# Patient Record
Sex: Male | Born: 2005 | Race: Black or African American | Hispanic: No | Marital: Single | Smoking: Never smoker
Health system: Southern US, Community
[De-identification: ages and names within clinical notes are randomized; demographics above are authoritative.]

## PROBLEM LIST (undated history)

## (undated) DIAGNOSIS — J45909 Unspecified asthma, uncomplicated: Secondary | ICD-10-CM

---
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
## 2024-01-16 ENCOUNTER — Ambulatory Visit: Admitting: Cardiology

## 2024-01-16 NOTE — Progress Notes (Deleted)
 Cardiology Office Note:    Date:  01/16/2024   ID:  Allen Willis, DOB 06/28/2006, MRN [id number]  PCP:  [facility]  Cardiologist:  None  Electrophysiologist:  None   Referring MD: Leanora Prophet, PA-C   No chief complaint on file. ***  History of Present Illness:    Allen Willis is a 18 y.o. male with a hx of asthma who is referred by Leanora Prophet, PA for evaluation of palpitations.  Past Medical History:  Diagnosis Date   Asthma     No past surgical history on file.  Current Medications: No outpatient medications have been marked as taking for the 01/16/24 encounter (Appointment) with Wendie Hamburg, MD.     Allergies:   Patient has no known allergies.   Social History   Socioeconomic History   Marital status: Single    Spouse name: Not on file   Number of children: Not on file   Years of education: Not on file   Highest education level: Not on file  Occupational History   Not on file  Tobacco Use   Smoking status: Never   Smokeless tobacco: Never  Substance and Sexual Activity   Alcohol use: No   Drug use: Not on file   Sexual activity: Not on file  Other Topics Concern   Not on file  Social History Narrative   Not on file   Social Drivers of Health   Financial Resource Strain: Not on File (12/27/2021)   Received from [employer]    Financial Resource Strain: 0  Food Insecurity: Not at Risk (01/07/2023)   Received from [facility] Insecurity    Food: 1  Transportation Needs: Not at Risk (01/07/2023)   Received from [employer] Needs    Transportation: 1  Physical Activity: Not on File (12/27/2021)   Received from [facility]   Physical Activity    Physical Activity: 0  Stress: Not on File (12/27/2021)   Received from [facility]   Stress    Stress: 0  Social Connections: Not on File (05/19/2023)   Received from [facility]   Social Connections    Connectedness: 0     Family  History: The patient's ***family history is not on file.  ROS:   Please see the history of present illness.    *** All other systems reviewed and are negative.  EKGs/Labs/Other Studies Reviewed:    The following studies were reviewed today: ***  EKG:  EKG is *** ordered today.  The ekg ordered today demonstrates ***  Recent Labs: No results found for requested labs within last 365 days.  Recent Lipid Panel No results found for: "CHOL", "TRIG", "HDL", "CHOLHDL", "VLDL", "LDLCALC", "LDLDIRECT"  Physical Exam:    VS:  There were no vitals taken for this visit.    Wt Readings from Last 3 Encounters:  10/02/18 118 lb 2.7 oz (53.6 kg) (65%, Z= 0.39)*  06/22/16 99 lb (44.9 kg) (79%, Z= 0.80)*  06/06/14 80 lb (36.3 kg) (84%, Z= 1.00)*   * Growth percentiles are based on CDC (Boys, 2-20 Years) data.     GEN: *** Well nourished, well developed in no acute distress HEENT: Normal NECK: No JVD; No carotid bruits LYMPHATICS: No lymphadenopathy CARDIAC: ***RRR, no murmurs, rubs, gallops RESPIRATORY:  Clear to auscultation without rales, wheezing or rhonchi  ABDOMEN: Soft, non-tender, non-distended MUSCULOSKELETAL:  No edema; No deformity  SKIN: Warm and dry NEUROLOGIC:  Alert and oriented x 3 PSYCHIATRIC:  Normal affect   ASSESSMENT:    No diagnosis found. PLAN:    Palpitations:  RTC***  Medication Adjustments/Labs and Tests Ordered: Current medicines are reviewed at length with the patient today.  Concerns regarding medicines are outlined above.  No orders of the defined types were placed in this encounter.  No orders of the defined types were placed in this encounter.   There are no Patient Instructions on file for this visit.   Signed, Wendie Hamburg, MD  01/16/2024 8:19 AM    [facility]

## 2024-02-06 ENCOUNTER — Other Ambulatory Visit: Payer: Self-pay | Admitting: Medical Genetics

## 2024-02-23 ENCOUNTER — Ambulatory Visit: Attending: Cardiology

## 2024-02-23 ENCOUNTER — Ambulatory Visit

## 2024-02-23 ENCOUNTER — Encounter: Payer: Self-pay | Admitting: Cardiology

## 2024-02-23 ENCOUNTER — Other Ambulatory Visit: Payer: Self-pay | Admitting: *Deleted

## 2024-02-23 ENCOUNTER — Ambulatory Visit: Attending: Cardiology | Admitting: Cardiology

## 2024-02-23 ENCOUNTER — Other Ambulatory Visit: Payer: Self-pay | Admitting: Cardiology

## 2024-02-23 VITALS — BP 110/50 | HR 76 | Ht 70.5 in | Wt 167.0 lb

## 2024-02-23 DIAGNOSIS — I499 Cardiac arrhythmia, unspecified: Secondary | ICD-10-CM

## 2024-02-23 DIAGNOSIS — R000 Tachycardia, unspecified: Secondary | ICD-10-CM

## 2024-02-23 DIAGNOSIS — R42 Dizziness and giddiness: Secondary | ICD-10-CM | POA: Diagnosis present

## 2024-02-23 DIAGNOSIS — I279 Pulmonary heart disease, unspecified: Secondary | ICD-10-CM

## 2024-02-23 DIAGNOSIS — R002 Palpitations: Secondary | ICD-10-CM | POA: Insufficient documentation

## 2024-02-23 NOTE — Progress Notes (Unsigned)
 Cardiology Office Note:  .   Date:  02/25/2024  ID:  Allen Willis, DOB 11-15-2005, MRN [id number] PCP: [facility]  [facility] Providers Cardiologist:  Randene Bustard, MD     No chief complaint on file.   Patient Profile: .     Allen Willis is a  18 y.o. male with a no prior PMH who presents here for evaluation of palpitations, and possible syncope at the request of Leanora Prophet, PA-C. -     Allen Willis was seen on January 07, 2024 by Leanora Prophet, PA noting feeling his heart racing.  Episodes last maybe 10 to 20 seconds.  Usually occur when resting or lying down.  Often times has a sleeping.  No triggers.  Also notes some orthostatic dizziness when standing up quickly after sitting down.  Maybe 2-3 syncopal episodes.  Thyroid levels checked along with A1c, lipid panel, iron panel/CBC: Referral to cardiology.  Subjective  Discussed the use of AI scribe software for clinical note transcription with the patient, who gave verbal consent to proceed.  History of Present Illness History of Present Illness Allen Willis is a 18 year old male who presents with palpitations and episodes of dizziness. He is accompanied by his mother.  He experiences palpitations described as his heart beating hard, particularly upon waking or stretching. These episodes occur approximately three times a month and last about five to seven seconds. No associated syncope or near-syncope during these episodes. He denies any irregular or skipping heartbeats during these episodes.  He experiences dizziness when standing up too quickly after resting, especially post-exercise. The dizziness is more pronounced when he has not consumed enough water before exercising. He drinks water before and after workouts but acknowledges that inadequate hydration exacerbates the symptoms. The last episode of near-syncope occurred a couple of months ago after playing  soccer without adequate hydration or nutrition.  No chest pain, syncope, or irregular heartbeats. He denies the use of caffeine, sodas, or energy drinks but consumes sweet drinks and milkshakes occasionally. 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  His father's side of the family has a history of heart issues, though details are unspecified.     Objective   At last PCPs visit, notes he drinks at least 2 big bottles of water a day.  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  No alcohol vaping, cigarettes or drugs.  Studies Reviewed: Aaron Aas   EKG Interpretation Date/Time:  Monday February 23 2024 13:48:45 EDT Ventricular Rate:  76 PR Interval:  140 QRS Duration:  90 QT Interval:  342 QTC Calculation: 384 R Axis:   76  Text Interpretation: Normal sinus rhythm with sinus arrhythmia Normal ECG No previous ECGs available Confirmed by Randene Bustard ([id number]) on 02/23/2024 2:03:08 PM    No studies  Labs from PCP: 01/07/2023: TC 149, TG 50, HDL 69, LDL 69.;  TSH 0.877, free X32.44 01/09/2024: TC 143, TG 91, HDL 58, LDL 68.;  TSH 1.41, free T4 1.01; BUN/Cr 10/0.99, K+ 4.8.  Magnesium 2.  Normal iron panel   Risk Assessment/Calculations:          Physical Exam:   VS:  BP (!) 110/50   Pulse 76   Ht 5' 10.5 (1.791 m)   Wt 167 lb (75.8 kg)   SpO2 98%   BMI 23.62 kg/m    Wt Readings from Last 3 Encounters:  02/23/24  167 lb (75.8 kg) (70%, Z= 0.53)*  10/02/18 118 lb 2.7 oz (53.6 kg) (65%, Z= 0.39)*  06/22/16 99 lb (44.9 kg) (79%, Z= 0.80)*   * Growth percentiles are based on CDC (Boys, 2-20 Years) data.    GEN: Well nourished, well developed in no acute distress; well-groomed.  Healthy-appearing NECK: No JVD; No carotid bruits CARDIAC: Normal S1 and S2; RRR, no murmurs, rubs, gallops RESPIRATORY:  Clear to auscultation without rales, wheezing or rhonchi ; nonlabored, good air movement. ABDOMEN: Soft, non-tender,  non-distended EXTREMITIES:  No edema; No deformity      ASSESSMENT AND PLAN: .    Problem List Items Addressed This Visit     Dizziness   Dizziness with postural changes likely due to dehydration and transient hypotension. Emphasized hydration to prevent symptoms. - Advise on adequate hydration before and after physical activity to prevent dizziness.      Relevant Orders   EKG 12-Lead (Completed)   Palpitations - Primary   Intermittent palpitations likely benign tachycardia, possibly due to dehydration or electrolyte imbalance. No syncope or irregular heartbeats. Discussed triggers and management strategies. - Order 30-day cardiac monitor to evaluate heart rhythm during episodes. - Educate on maintaining hydration, especially before and after exercise. - Instruct on performing vagal maneuvers during episodes to slow heart rate.              Follow-Up: Return in about 6 weeks (around 04/05/2024) for Routine Follow-up after testing ~ 1-2 months, Followup with Telemedicine, To discuss test results.     Signed, Arleen Lacer, MD, MS Randene Bustard, M.D., M.S. Interventional Chartered certified accountant  Pager # [phone]

## 2024-02-23 NOTE — Progress Notes (Unsigned)
 Enrolled for [employer] to mail a ZIO XT long term holter monitor to the patients address on file.   2nd consecutive 14 day zio xt monitor

## 2024-02-23 NOTE — Progress Notes (Unsigned)
 Enrolled for [employer] to mail a ZIO XT long term holter monitor to the patients address on file.  First of 2 consecutive 14 day zio monitors.

## 2024-02-23 NOTE — Patient Instructions (Addendum)
 Medication Instructions:   No changes   Other Instructions  Keep well hydrated , if you are going to exercise  hydrate the day before and  day of   Lab Work: Not needed    Testing/Procedures: Your physician has recommended that you wear a holter monitor Zio 14 day x2 . Holter monitors are medical devices that record the heart's electrical activity. Doctors most often use these monitors to diagnose arrhythmias. Arrhythmias are problems with the speed or rhythm of the heartbeat. The monitor is a small, portable device. You can wear one while you do your normal daily activities. This is usually used to diagnose what is causing palpitations/syncope (passing out).    Follow-Up: At [facility], you and your health needs are our priority.  As part of our continuing mission to provide you with exceptional heart care, we have created designated Provider Care Teams.  These Care Teams include your primary Cardiologist (physician) and Advanced Practice Providers (APPs -  Physician Assistants and Nurse Practitioners) who all work together to provide you with the care you need, when you need it.     Your next appointment:   2 month(s)  The format for your next appointment:   Virtual Visit   Provider:   Randene Bustard, MD   Other Instructions    ZIO XT- Long Term Monitor Instructions  Your physician has requested you wear a ZIO patch monitor for 30 days.  This is a single patch monitor. [employer] supplies one patch monitor per enrollment. Additional stickers are not available. Please do not apply patch if you will be having a Nuclear Stress Test,  Echocardiogram, Cardiac CT, MRI, or Chest Xray during the period you would be wearing the  monitor. The patch cannot be worn during these tests. You cannot remove and re-apply the  ZIO XT patch monitor.  Your ZIO patch monitor will be mailed 3 day USPS to your address on file. It may take 3-5 days  to receive your monitor after you have been  enrolled.  Once you have received your monitor, please review the enclosed instructions. Your monitor  has already been registered assigning a specific monitor serial # to you.  Billing and Patient Assistance Program Information  We have supplied [employer] with any of your insurance information on file for billing purposes. [employer] offers a sliding scale Patient Assistance Program for patients that do not have  insurance, or whose insurance does not completely cover the cost of the ZIO monitor.  You must apply for the Patient Assistance Program to qualify for this discounted rate.  To apply, please call [employer] at [phone], select option 4, select option 2, ask to apply for  Patient Assistance Program. Sanna Crystal will ask your household income, and how many people  are in your household. They will quote your out-of-pocket cost based on that information.  [employer] will also be able to set up a 12-month, interest-free payment plan if needed.  Applying the monitor   Shave hair from upper left chest.  Hold abrader disc by orange tab. Rub abrader in 40 strokes over the upper left chest as  indicated in your monitor instructions.  Clean area with 4 enclosed alcohol pads. Let dry.  Apply patch as indicated in monitor instructions. Patch will be placed under collarbone on left  side of chest with arrow pointing upward.  Rub patch adhesive wings for 2 minutes. Remove white label marked 1. Remove the white  label marked 2. Rub patch adhesive wings for  2 additional minutes.  While looking in a mirror, press and release button in center of patch. A small green light will  flash 3-4 times. This will be your only indicator that the monitor has been turned on.  Do not shower for the first 24 hours. You may shower after the first 24 hours.  Press the button if you feel a symptom. You will hear a small click. Record Date, Time and  Symptom in the Patient Logbook.  When you are ready to remove the  patch, follow instructions on the last 2 pages of Patient  Logbook. Stick patch monitor onto the last page of Patient Logbook.  Place Patient Logbook in the blue and white box. Use locking tab on box and tape box closed  securely. The blue and white box has prepaid postage on it. Please place it in the mailbox as  soon as possible. Your physician should have your test results approximately 7 days after the  monitor has been mailed back to [facility].  Call [facility] Customer Care at [phone] if you have questions regarding  your ZIO XT patch monitor. Call them immediately if you see an orange light blinking on your  monitor.  If your monitor falls off in less than 4 days, contact our Monitor department at [phone].  If your monitor becomes loose or falls off after 4 days call [employer] at [phone] for  suggestions on securing your monitor

## 2024-02-25 DIAGNOSIS — R42 Dizziness and giddiness: Secondary | ICD-10-CM | POA: Insufficient documentation

## 2024-02-25 DIAGNOSIS — R002 Palpitations: Secondary | ICD-10-CM | POA: Insufficient documentation

## 2024-02-25 NOTE — Assessment & Plan Note (Signed)
 Dizziness with postural changes likely due to dehydration and transient hypotension. Emphasized hydration to prevent symptoms. - Advise on adequate hydration before and after physical activity to prevent dizziness.

## 2024-02-25 NOTE — Assessment & Plan Note (Signed)
 Intermittent palpitations likely benign tachycardia, possibly due to dehydration or electrolyte imbalance. No syncope or irregular heartbeats. Discussed triggers and management strategies. - Order 30-day cardiac monitor to evaluate heart rhythm during episodes. - Educate on maintaining hydration, especially before and after exercise. - Instruct on performing vagal maneuvers during episodes to slow heart rate.

## 2024-02-27 ENCOUNTER — Other Ambulatory Visit

## 2024-04-16 ENCOUNTER — Telehealth: Payer: Self-pay

## 2024-04-16 ENCOUNTER — Ambulatory Visit: Attending: Cardiology | Admitting: Cardiology

## 2024-04-16 ENCOUNTER — Encounter: Payer: Self-pay | Admitting: Cardiology

## 2024-04-16 DIAGNOSIS — R002 Palpitations: Secondary | ICD-10-CM

## 2024-04-16 NOTE — Patient Instructions (Signed)
 Medication Instructions:  No changes   *If you need a refill on your cardiac medications before your next appointment, please call your pharmacy*   Lab Work:  Not needed   Testing/Procedures:  Continue   wear the second  ZIO monitor and turn it back in to the company  Follow-Up: At [facility], you and your health needs are our priority.  As part of our continuing mission to provide you with exceptional heart care, we have created designated Provider Care Teams.  These Care Teams include your primary Cardiologist (physician) and Advanced Practice Providers (APPs -  Physician Assistants and Nurse Practitioners) who all work together to provide you with the care you need, when you need it.     Your next appointment:   As  needed  The format for your next appointment:   In Person  Provider:   Alm Clay, MD

## 2024-04-16 NOTE — Progress Notes (Signed)
 Virtual Visit via Video Note   Because of [name]'s co-morbid illnesses, he is at least at moderate risk for complications without adequate follow up.  (He is currently out of town ready to start college and therefore was difficult for him to return back to [place] to be evaluated.  This format is felt to be most appropriate for this patient at this time.  All issues noted in this document were discussed and addressed.  A limited physical exam was performed with this format.  Please refer to the patient's chart for his consent to telehealth for [facility]. Tomorrow Patient has given verbal permission to conduct this visit via virtual appointment and to bill insurance 04/16/2024 4:37 PM     Patient has given verbal permission to conduct this visit via virtual appointment and to bill insurance 04/16/2024 4:38 PM     Evaluation Performed:  Follow-up visit  Date:  04/16/2024   ID:  Allen Willis, DOB Sep 14, 2005, MRN [id number]  Patient Location: Home Provider Location: Office/Clinic  PCP:  [facility]  Cardiologist:  Alm Clay, MD  Electrophysiologist:  None     Chief Complaint  Patient presents with   Follow-up    To discuss test results    Patient Profile: .     Allen Willis is a otherwise healthy 18 y.o. male with no real PMH who presents here for telehealth-video conferencing follow-up to discuss results of event monitor.  At the request of [facility]  Argel I Washington was initially seen February 23 2024 for evaluation of palpitations and near syncope on for evaluation of palpitations, and possible syncope at the request of Buck Search, PA-C.   Anthone I Flemmer was last seen on June 16 with complaints of palpitations and dizziness.  Episodes described as feeling his heart beating fast usually with walking or stretching.  Happened a few times a month and last few seconds.  Not associate with  syncope or near syncope.  Also noticed some dizziness with standing up too quickly.  Otherwise pretty healthy.  Discussed minimizing caffeine which she had been doing.  Also discussed increasing hydration.  Recommended avoiding energy drinks or sweet drinks.  Subjective  Discussed the use of AI scribe software for clinical note transcription with the patient, who gave verbal consent to proceed.  History of Present Illness  Allen Willis is a 18 year old male who presents to discuss results of his monitor.  Unfortunately he was not able to complete the ration of monitoring.  The first monitor came off because of sweat related to his heavily exertional job that he was working.  He now is taking a break before starting back college and then [employer] on August 18.  He still has a second monitor to wear.  He has experienced a significant reduction in palpitations and is feeling well overall. No dizziness is noted, and there is an improvement in symptoms.  During the monitoring period, he was working at [employer], which involved physical exertion, potentially contributing to increased heart rates observed on the monitor. The heart monitor recorded a heart rate as low as 43 beats per minute during sleep and as high as 180 beats per minute during periods of activity. He did not experience any concerning symptoms during these episodes and did not press the event button on the monitor. He attributes the high heart rate to physical activity at work.  He mentions difficulty with  the monitor staying attached due to sweating while working, which led to the monitor falling off. He has an additional monitor available.  He confirms adequate hydration and nutrition, stating he drinks a lot of water every day and ensures he eats regularly. He is currently not in school and plans to return on August 19th.   Objective   He takes Zyrtec and Flonase for and allergies but otherwise no medicines.  Studies Reviewed: SABRA         ~ 7 D ZIO Patch Wear Time:  6 days and 12 hours (2025-07-05T00:51:07-399 to 2025-07-11T12:51:41-0400) Patient had a min HR of 43 bpm, max HR of 182 bpm, and avg HR of 75 bpm. Predominant underlying rhythm was Sinus Rhythm. Isolated SVEs were rare (<1.0%), and no SVE Couplets or SVE Triplets were present. Isolated VEs were rare (<1.0%), and no VE Couplets  or VE Triplets were present.   Risk Assessment/Calculations:         Physical Exam:   VS:  There were no vitals taken for this visit.   Wt Readings from Last 3 Encounters:  02/23/24 167 lb (75.8 kg) (70%, Z= 0.53)*  10/02/18 118 lb 2.7 oz (53.6 kg) (65%, Z= 0.39)*  06/22/16 99 lb (44.9 kg) (79%, Z= 0.80)*   * Growth percentiles are based on CDC (Boys, 2-20 Years) data.    GEN: Well nourished, well developed in no acute distress; healthy-appearing.     ASSESSMENT AND PLAN: .    Problem List Items Addressed This Visit     Palpitations - Primary   Palpitations/dizziness Holter monitor showed no dangerous arrhythmias, only rare extra beats. Heart rate ranged from 43 bpm during sleep to 180 bpm during exertion, normal for his age. No symptoms during high heart rate episodes. Potential causes include dehydration or caffeine intake. - Wear extra Holter monitor during less physical activity to capture unusual heart activity. - Push button on monitor if experiencing symptoms. - Ensure adequate hydration and monitor caffeine intake. - PRN follow-up if concerning symptoms detected on monitor.              Follow-Up: Return if symptoms worsen or fail to improve, for Followup when necessary.  Total time spent: 9 min spent with patient + 9 min spent charting (reviewing studies and previous note as well as dictating) = 18 min    Signed, Alm MICAEL Clay, MD, MS Alm Clay, M.D., M.S. Interventional Chartered certified accountant  Pager # [phone]

## 2024-04-16 NOTE — Telephone Encounter (Signed)
  Patient Consent for Virtual Visit          BRAIDON CHERMAK has provided verbal consent on 04/16/2024 for a virtual visit (video or telephone).   CONSENT FOR VIRTUAL VISIT FOR:  [place]  By participating in this virtual visit I agree to the following:  I hereby voluntarily request, consent and authorize [facility] and its employed or contracted physicians, physician assistants, nurse practitioners or other licensed health care professionals (the Practitioner), to provide me with telemedicine health care services (the "Services) as deemed necessary by the treating Practitioner. I acknowledge and consent to receive the Services by the Practitioner via telemedicine. I understand that the telemedicine visit will involve communicating with the Practitioner through live audiovisual communication technology and the disclosure of certain medical information by electronic transmission. I acknowledge that I have been given the opportunity to request an in-person assessment or other available alternative prior to the telemedicine visit and am voluntarily participating in the telemedicine visit.  I understand that I have the right to withhold or withdraw my consent to the use of telemedicine in the course of my care at any time, without affecting my right to future care or treatment, and that the Practitioner or I may terminate the telemedicine visit at any time. I understand that I have the right to inspect all information obtained and/or recorded in the course of the telemedicine visit and may receive copies of available information for a reasonable fee.  I understand that some of the potential risks of receiving the Services via telemedicine include:  Delay or interruption in medical evaluation due to technological equipment failure or disruption; Information transmitted may not be sufficient (e.g. poor resolution of images) to allow for appropriate medical decision making by the  Practitioner; and/or  In rare instances, security protocols could fail, causing a breach of personal health information.  Furthermore, I acknowledge that it is my responsibility to provide information about my medical history, conditions and care that is complete and accurate to the best of my ability. I acknowledge that Practitioner's advice, recommendations, and/or decision may be based on factors not within their control, such as incomplete or inaccurate data provided by me or distortions of diagnostic images or specimens that may result from electronic transmissions. I understand that the practice of medicine is not an exact science and that Practitioner makes no warranties or guarantees regarding treatment outcomes. I acknowledge that a copy of this consent can be made available to me via my patient portal [facility] MyChart), or I can request a printed copy by calling the office of [facility].    I understand that my insurance will be billed for this visit.   I have read or had this consent read to me. I understand the contents of this consent, which adequately explains the benefits and risks of the Services being provided via telemedicine.  I have been provided ample opportunity to ask questions regarding this consent and the Services and have had my questions answered to my satisfaction. I give my informed consent for the services to be provided through the use of telemedicine in my medical care

## 2024-04-16 NOTE — Assessment & Plan Note (Signed)
 Palpitations/dizziness Holter monitor showed no dangerous arrhythmias, only rare extra beats. Heart rate ranged from 43 bpm during sleep to 180 bpm during exertion, normal for his age. No symptoms during high heart rate episodes. Potential causes include dehydration or caffeine intake. - Wear extra Holter monitor during less physical activity to capture unusual heart activity. - Push button on monitor if experiencing symptoms. - Ensure adequate hydration and monitor caffeine intake. - PRN follow-up if concerning symptoms detected on monitor.

## 2024-04-27 ENCOUNTER — Ambulatory Visit

## 2024-06-18 ENCOUNTER — Other Ambulatory Visit: Payer: Self-pay | Admitting: Medical Genetics

## 2024-06-18 DIAGNOSIS — Z006 Encounter for examination for normal comparison and control in clinical research program: Secondary | ICD-10-CM

## 2024-07-26 LAB — GENECONNECT MOLECULAR SCREEN: Genetic Analysis Overall Interpretation: NEGATIVE

## 2024-08-09 ENCOUNTER — Encounter (HOSPITAL_COMMUNITY): Payer: Self-pay | Admitting: *Deleted

## 2024-08-09 ENCOUNTER — Other Ambulatory Visit: Payer: Self-pay

## 2024-08-09 ENCOUNTER — Ambulatory Visit (HOSPITAL_COMMUNITY)
Admission: EM | Admit: 2024-08-09 | Discharge: 2024-08-09 | Disposition: A | Discharge status: Home / Self Care | Attending: Nurse Practitioner | Admitting: Nurse Practitioner

## 2024-08-09 DIAGNOSIS — R509 Fever, unspecified: Secondary | ICD-10-CM | POA: Diagnosis not present

## 2024-08-09 DIAGNOSIS — J111 Influenza due to unidentified influenza virus with other respiratory manifestations: Secondary | ICD-10-CM | POA: Diagnosis not present

## 2024-08-09 DIAGNOSIS — R000 Tachycardia, unspecified: Secondary | ICD-10-CM

## 2024-08-09 DIAGNOSIS — R42 Dizziness and giddiness: Secondary | ICD-10-CM | POA: Diagnosis not present

## 2024-08-09 DIAGNOSIS — I499 Cardiac arrhythmia, unspecified: Secondary | ICD-10-CM | POA: Diagnosis not present

## 2024-08-09 LAB — POCT INFLUENZA A/B
Influenza A, POC: NEGATIVE
Influenza B, POC: NEGATIVE

## 2024-08-09 MED ORDER — IBUPROFEN 800 MG PO TABS
ORAL_TABLET | ORAL | Status: AC
  Filled 2024-08-09: qty 1

## 2024-08-09 MED ORDER — IBUPROFEN 800 MG PO TABS
800.0000 mg | ORAL_TABLET | Freq: Once | ORAL | Status: AC
  Administered 2024-08-09: 800 mg via ORAL

## 2024-08-09 MED ORDER — ACETAMINOPHEN 325 MG PO TABS
ORAL_TABLET | ORAL | Status: AC
  Filled 2024-08-09: qty 3

## 2024-08-09 MED ORDER — ACETAMINOPHEN 325 MG PO TABS
975.0000 mg | ORAL_TABLET | Freq: Once | ORAL | Status: AC
  Administered 2024-08-09: 975 mg via ORAL

## 2024-08-09 MED ORDER — ALBUTEROL SULFATE HFA 108 (90 BASE) MCG/ACT IN AERS: 1.0000 | INHALATION_SPRAY | Freq: Four times a day (QID) | RESPIRATORY_TRACT | 0 refills | Status: AC | PRN

## 2024-08-09 NOTE — ED Provider Notes (Signed)
 [facility]    CSN: [id number] Arrival date & time: 08/09/24  9096      History   Chief Complaint Chief Complaint  Patient presents with   Cough   Sore Throat   Fever    HPI Allen Willis is a 18 y.o. male.   Discussed the use of AI scribe software for clinical note transcription with the patient, who gave verbal consent to proceed.   The patient presents with a sore throat and subjective fevers that began yesterday. He reports associated chills and headaches but denies body aches, rhinorrhea, nasal congestion, sneezing, nausea, vomiting, or diarrhea. He has not taken any medications for symptom relief. The patient reports recent exposure to illness, as his brother tested positive for influenza two days ago after being symptomatic for several days. He denies any other known sick contacts.  The patient has a history of asthma and requests a refill of his albuterol  inhaler. He denies shortness of breath, wheezing or chest tightness.   The following sections of the patient's history were reviewed and updated as appropriate: allergies, current medications, past family history, past medical history, past social history, past surgical history, and problem list.      Past Medical History:  Diagnosis Date   Asthma     Patient Active Problem List   Diagnosis Date Noted   Palpitations 02/25/2024   Dizziness 02/25/2024    History reviewed. No pertinent surgical history.     Home Medications    Prior to Admission medications   Medication Sig Start Date End Date Taking? Authorizing Provider  albuterol  (VENTOLIN  HFA) 108 (90 Base) MCG/ACT inhaler Inhale 1-2 puffs into the lungs every 6 (six) hours as needed for wheezing or shortness of breath. 08/09/24 09/08/24  Shela Esses, FNP  cetirizine (ZYRTEC) 5 MG tablet Take 5 mg by mouth daily. Patient taking differently: Take 5 mg by mouth as needed.    [provider]  fluticasone (FLONASE) 50  MCG/ACT nasal spray Place into both nostrils daily. Patient taking differently: Place 1 spray into both nostrils as needed.    [provider]    Family History Family History  Problem Relation Age of Onset   Healthy Mother    Healthy Father    Healthy Sister    Healthy Sister    Healthy Brother     Social History Social History   Tobacco Use   Smoking status: Never   Smokeless tobacco: Never  Substance Use Topics   Alcohol use: No     Allergies   Pollen extract   Review of Systems Review of Systems  Constitutional:  Positive for chills and fever (subjective). Negative for diaphoresis.  HENT:  Positive for sore throat. Negative for congestion, rhinorrhea and sneezing.   Respiratory:  Positive for cough. Negative for chest tightness, shortness of breath and wheezing.   Gastrointestinal:  Negative for diarrhea, nausea and vomiting.  Musculoskeletal:  Negative for myalgias.  Neurological:  Positive for headaches.  All other systems reviewed and are negative.    Physical Exam Triage Vital Signs ED Triage Vitals  Encounter Vitals Group     BP 08/09/24 0957 (!) 90/54     Girls Systolic BP Percentile --      Girls Diastolic BP Percentile --      Boys Systolic BP Percentile --      Boys Diastolic BP Percentile --      Pulse Rate 08/09/24 0957 (!) 115     Resp 08/09/24  0957 20     Temp 08/09/24 0957 (!) 102.8 F (39.3 C)     Temp src --      SpO2 08/09/24 0957 96 %     Weight --      Height --      Head Circumference --      Peak Flow --      Pain Score 08/09/24 0954 7     Pain Loc --      Pain Education --      Exclude from Growth Chart --    No data found.  Updated Vital Signs BP 109/66   Pulse (!) 115   Temp 98.7 F (37.1 C) (Oral)   Resp 20   SpO2 96%   Visual Acuity Right Eye Distance:   Left Eye Distance:   Bilateral Distance:    Right Eye Near:   Left Eye Near:    Bilateral Near:     Physical Exam Vitals and nursing note  reviewed.  Constitutional:      General: He is awake. He is not in acute distress.    Appearance: Normal appearance. He is well-developed. He is not ill-appearing, toxic-appearing or diaphoretic.  HENT:     Head: Normocephalic.     Right Ear: Tympanic membrane, ear canal and external ear normal. No drainage, swelling or tenderness. No middle ear effusion. Tympanic membrane is not erythematous.     Left Ear: Tympanic membrane, ear canal and external ear normal. No drainage, swelling or tenderness.  No middle ear effusion. Tympanic membrane is not erythematous.     Nose: No congestion or rhinorrhea.     Mouth/Throat:     Lips: Pink.     Mouth: Mucous membranes are moist.     Pharynx: Oropharynx is clear. Uvula midline. No pharyngeal swelling, oropharyngeal exudate, posterior oropharyngeal erythema or uvula swelling.     Tonsils: No tonsillar exudate or tonsillar abscesses.  Eyes:     General: Vision grossly intact.     Conjunctiva/sclera: Conjunctivae normal.  Cardiovascular:     Rate and Rhythm: Normal rate and regular rhythm.     Heart sounds: Normal heart sounds.  Pulmonary:     Effort: Pulmonary effort is normal. No tachypnea or respiratory distress.     Breath sounds: Normal breath sounds and air entry.     Comments: Respirations even and unlabored  Musculoskeletal:        General: Normal range of motion.     Cervical back: Normal range of motion and neck supple.  Lymphadenopathy:     Cervical: No cervical adenopathy.  Skin:    General: Skin is warm and dry.  Neurological:     General: No focal deficit present.     Mental Status: He is alert and oriented to person, place, and time.  Psychiatric:        Mood and Affect: Mood and affect normal.        Speech: Speech normal.        Behavior: Behavior is cooperative.      UC Treatments / Results  Labs (all labs ordered are listed, but only abnormal results are displayed) Labs Reviewed  POCT INFLUENZA A/B     EKG   Radiology No results found.  Procedures Procedures (including critical care time)  Medications Ordered in UC Medications  acetaminophen (TYLENOL) tablet 975 mg (975 mg Oral Given 08/09/24 1002)  ibuprofen (ADVIL) tablet 800 mg (800 mg Oral Given 08/09/24 1104)    Initial  Impression / Assessment and Plan / UC Course  I have reviewed the triage vital signs and the nursing notes.  Pertinent labs & imaging results that were available during my care of the patient were reviewed by me and considered in my medical decision making (see chart for details).     The patient presents with acute onset of sore throat, subjective fever, chills, and headache following recent confirmed exposure to influenza. Rapid influenza testing today is negative; however, given symptom onset within the past 24 hours and known exposure, early testing may yield a false-negative result. Clinical presentation remains consistent with a viral illness, most likely influenza, with no current evidence of bacterial infection. Supportive management was discussed, including adequate hydration, rest, and use of over-the-counter antipyretic and analgesic medications such as acetaminophen or ibuprofen for symptom relief. Oseltamivir  (Tamiflu ) was prescribed to help shorten the duration of illness and reduce symptom severity, given early symptom onset and exposure risk. The patient was advised to follow up with his primary care provider if symptoms fail to improve or worsen. He was instructed to seek emergency evaluation for difficulty breathing, chest pain, fever not responsive to medication, inability to tolerate fluids, significant weakness, or any sudden deterioration in condition.  Today's evaluation has revealed no signs of a dangerous process. Discussed diagnosis with patient and/or guardian. Patient and/or guardian aware of their diagnosis, possible red flag symptoms to watch out for and need for close follow up.  Patient and/or guardian understands verbal and written discharge instructions. Patient and/or guardian comfortable with plan and disposition.  Patient and/or guardian has a clear mental status at this time, good insight into illness (after discussion and teaching) and has clear judgment to make decisions regarding their care  Documentation was completed with the aid of voice recognition software. Transcription may contain typographical errors.  Final Clinical Impressions(s) / UC Diagnoses   Final diagnoses:  Fever, unspecified  Influenza-like illness     Discharge Instructions      You tested NEGATIVE for the FLU today; however, it is very possible that you have the flu given your symptoms and known exposure. Since your symptoms started 1 day ago, you are likely testing too early which is giving a false negative result.  Your symptoms are consistent with a viral illness, and there are no signs of a bacterial infection at this time.   The best treatment is supportive care while your body fights the virus. Make sure you drink plenty of fluids, get lots of rest, and use over-the-counter medicines like Tylenol or ibuprofen to help with fever, headache, and body aches.Tamiflu  was prescribed to help shorten the course of the illness and lessen the severity of symptoms; please take it exactly as directed.  Most people begin to feel better within a few days, though cough and fatigue can last longer. Please follow up with your primary care provider if you are not improving. Go to the emergency department right away if you have trouble breathing, chest pain, a fever that does not come down with medication, cannot keep fluids down, feel unusually weak, or if anything about your condition suddenly worsens.      ED Prescriptions     Medication Sig Dispense Auth. Provider   albuterol  (VENTOLIN  HFA) 108 (90 Base) MCG/ACT inhaler Inhale 1-2 puffs into the lungs every 6 (six) hours as needed for wheezing  or shortness of breath. 1 each Iola Lukes, FNP      PDMP not reviewed this encounter.   Elmo Shumard,  Lucie, FNP 08/09/24 1112

## 2024-08-09 NOTE — Discharge Instructions (Addendum)
 You tested NEGATIVE for the FLU today; however, it is very possible that you have the flu given your symptoms and known exposure. Since your symptoms started 1 day ago, you are likely testing too early which is giving a false negative result.  Your symptoms are consistent with a viral illness, and there are no signs of a bacterial infection at this time.   The best treatment is supportive care while your body fights the virus. Make sure you drink plenty of fluids, get lots of rest, and use over-the-counter medicines like Tylenol or ibuprofen to help with fever, headache, and body aches.Tamiflu  was prescribed to help shorten the course of the illness and lessen the severity of symptoms; please take it exactly as directed.  Most people begin to feel better within a few days, though cough and fatigue can last longer. Please follow up with your primary care provider if you are not improving. Go to the emergency department right away if you have trouble breathing, chest pain, a fever that does not come down with medication, cannot keep fluids down, feel unusually weak, or if anything about your condition suddenly worsens.

## 2024-08-09 NOTE — ED Triage Notes (Signed)
 Pt reports his brother tested positive for Flu. Pt has a sore throat,fever and cough.

## 2024-08-09 NOTE — ED Triage Notes (Signed)
 PT wants a refill on Albuterol  inhaler.

## 2024-08-10 ENCOUNTER — Ambulatory Visit: Payer: Self-pay | Admitting: Cardiology
